# Patient Record
Sex: Female | Born: 2000 | Hispanic: Yes | Marital: Single | Smoking: Never smoker
Health system: Southern US, Community
[De-identification: ages and names within clinical notes are randomized; demographics above are authoritative.]

---
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
## 2018-11-14 IMAGING — CT CT RENAL STONE PROTOCOL
2 of 3 series · 16 of 46 positions shown, 18 images · non-contrast
Comparison: None.

CLINICAL DATA: Acute onset of mid back pain.

EXAM:
CT ABDOMEN AND PELVIS WITHOUT CONTRAST
TECHNIQUE: Multidetector CT imaging of the abdomen and pelvis was performed
following the standard protocol without IV contrast.

[Series 4: lung · axial · 0.65mm/px · z∈[-199,-91]mm · 13 of 62 slices shown, 15 images]
[im 4/62  soft-tissue]
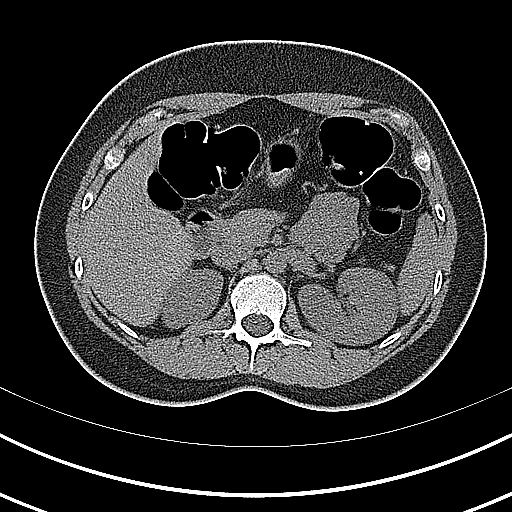
[im 4/62  bone]
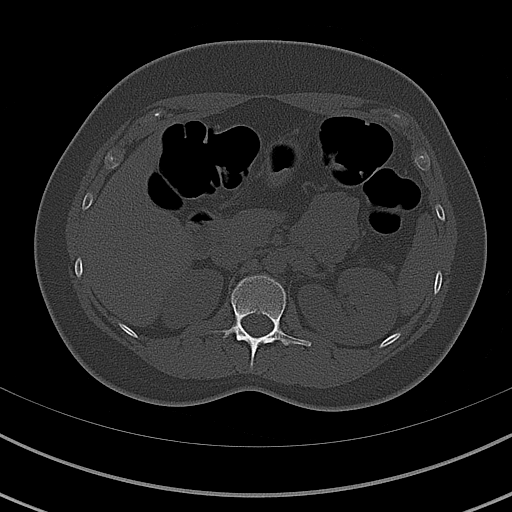
[im 8/62  soft-tissue]
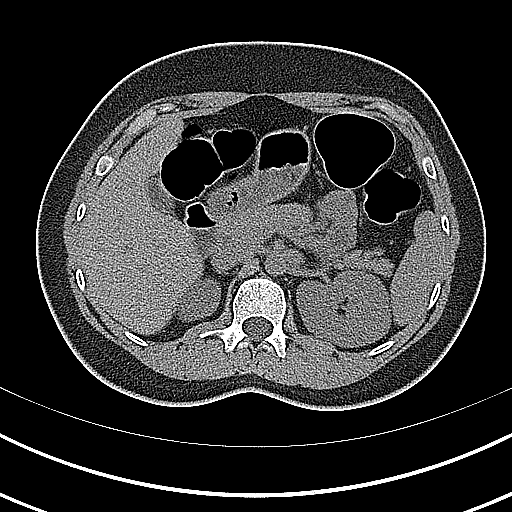
[im 12/62  soft-tissue]
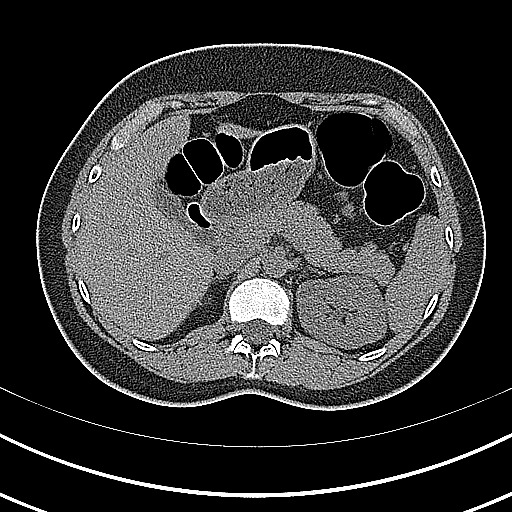
[im 18/62  soft-tissue]
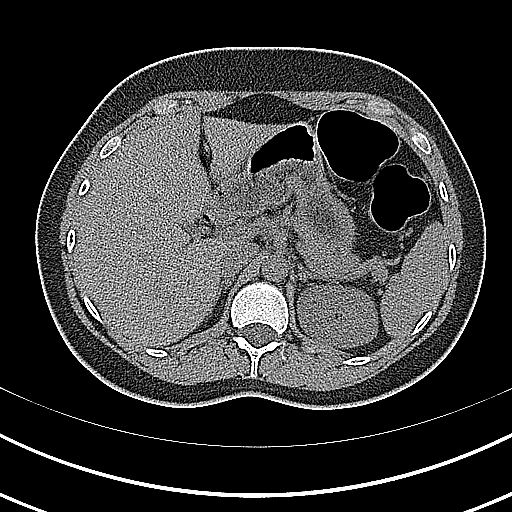
[im 22/62  soft-tissue]
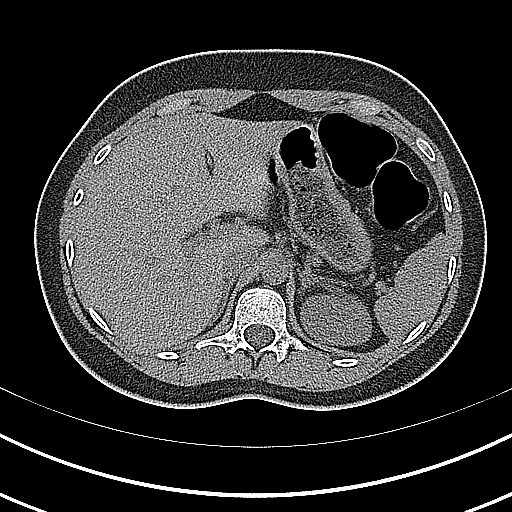
[im 26/62  soft-tissue]
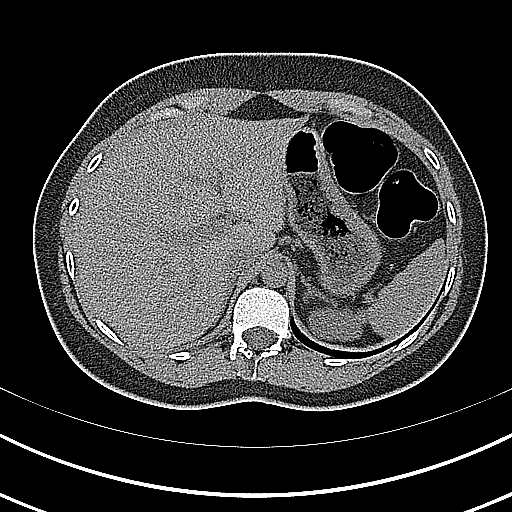
[im 32/62  soft-tissue]
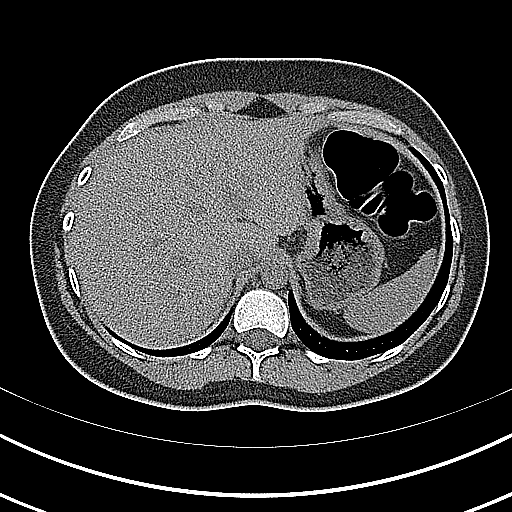
[im 36/62  soft-tissue]
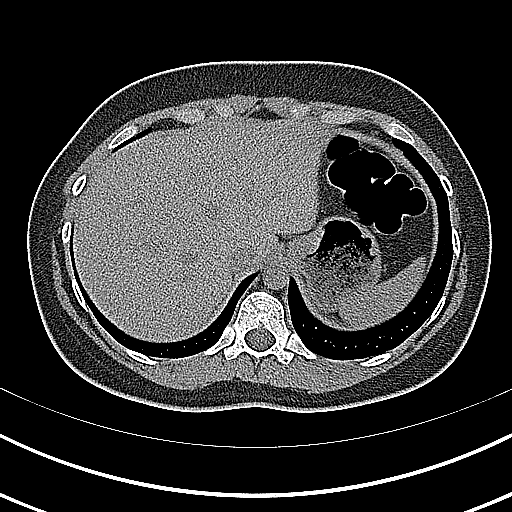
[im 40/62  soft-tissue]
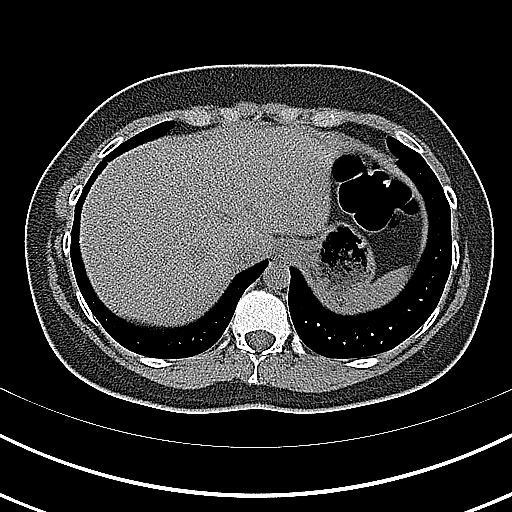
[im 40/62  bone]
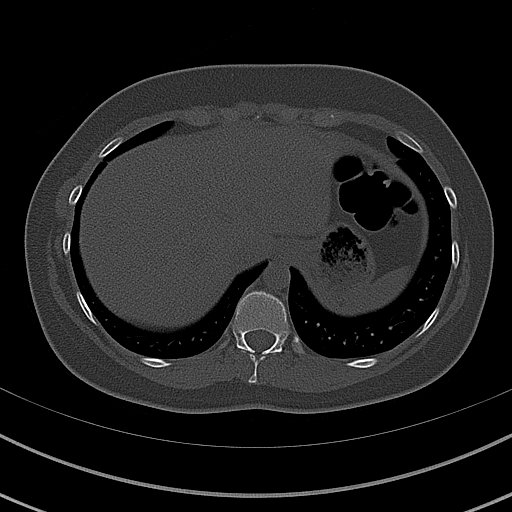
[im 44/62  soft-tissue]
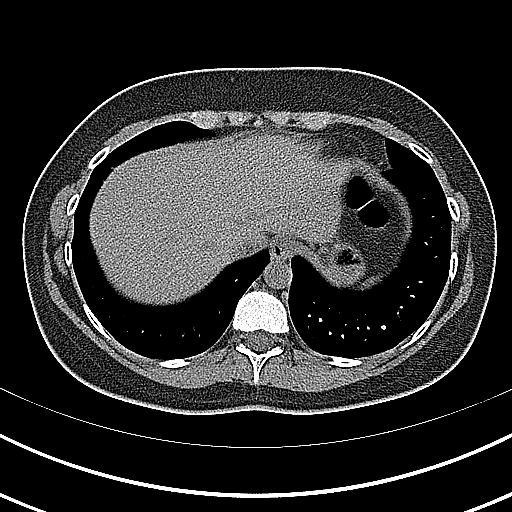
[im 50/62  soft-tissue]
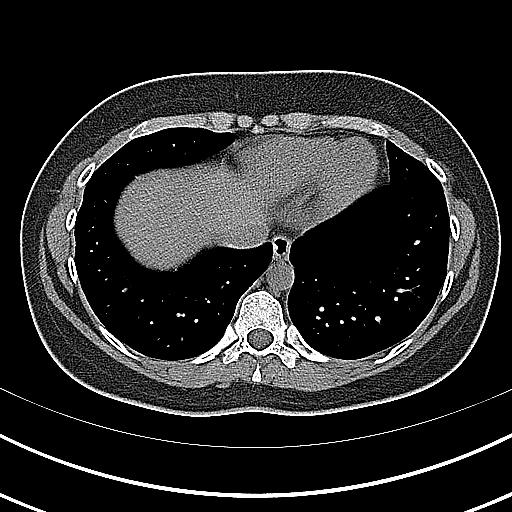
[im 54/62  soft-tissue]
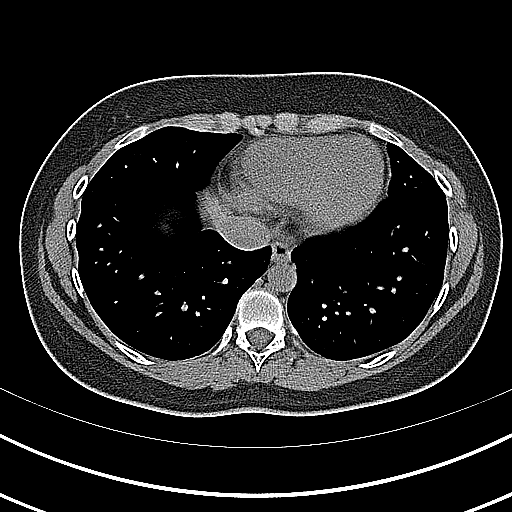
[im 58/62  soft-tissue]
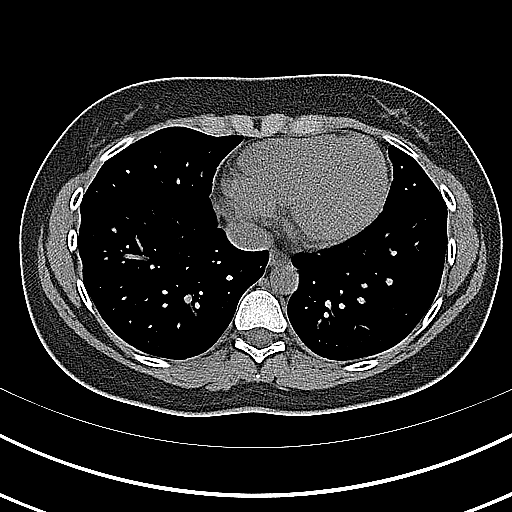

[Series 5: coronal · coronal · 0.68mm/px · 3 of 113 slices shown]
[im 38/113  soft-tissue]
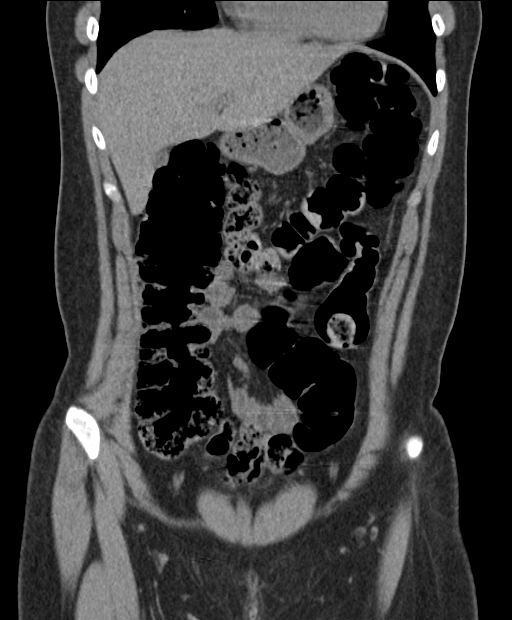
[im 50/113  soft-tissue]
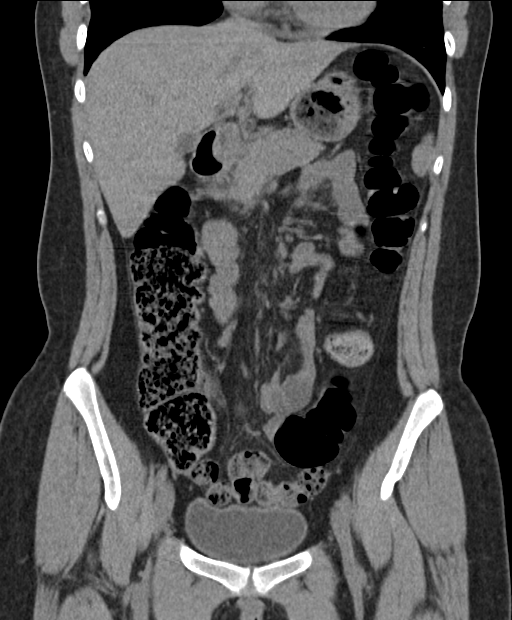
[im 63/113  soft-tissue]
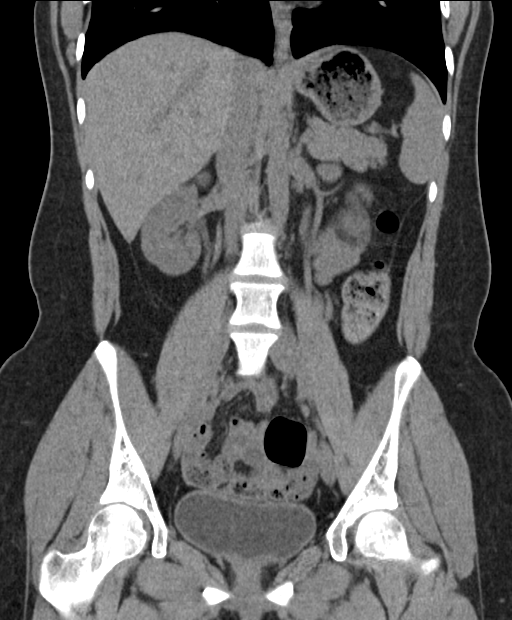

[16 of 46 positions shown; findings below may reference images not displayed]

FINDINGS: Lower chest: The visualized lung bases are grossly clear. The
visualized portions of the mediastinum are unremarkable.

Hepatobiliary: The liver is unremarkable in appearance. The
gallbladder is unremarkable in appearance. The common bile duct
remains normal in caliber.

Pancreas: The pancreas is within normal limits.

Spleen: The spleen is unremarkable in appearance.

Adrenals/Urinary Tract: The adrenal glands are unremarkable in
appearance. The kidneys are within normal limits. There is no
evidence of hydronephrosis. No renal or ureteral stones are
identified. No perinephric stranding is seen.

Stomach/Bowel: The stomach is unremarkable in appearance. The small
bowel is within normal limits. The appendix is normal in caliber,
without evidence of appendicitis. The colon is unremarkable in
appearance.

Vascular/Lymphatic: The abdominal aorta is unremarkable in
appearance. The inferior vena cava is grossly unremarkable. No
retroperitoneal lymphadenopathy is seen. No pelvic sidewall
lymphadenopathy is identified.

Reproductive: The bladder is mildly distended and within normal
limits. The uterus is grossly unremarkable in appearance. The
ovaries are relatively symmetric. No suspicious adnexal masses are
seen.

Other: No additional soft tissue abnormalities are seen.

Musculoskeletal: No acute osseous abnormalities are identified. The
visualized musculature is unremarkable in appearance.
IMPRESSION: Unremarkable noncontrast CT of the abdomen and pelvis.
